# Patient Record
Sex: Female | Born: 2000 | Race: White | Hispanic: No | Marital: Single | Smoking: Former smoker
Health system: Southern US, Community
[De-identification: ages and names within clinical notes are randomized; demographics above are authoritative.]

## PROBLEM LIST (undated history)

## (undated) DIAGNOSIS — F988 Other specified behavioral and emotional disorders with onset usually occurring in childhood and adolescence: Secondary | ICD-10-CM

## (undated) DIAGNOSIS — T7840XA Allergy, unspecified, initial encounter: Secondary | ICD-10-CM

## (undated) DIAGNOSIS — J189 Pneumonia, unspecified organism: Secondary | ICD-10-CM

## (undated) DIAGNOSIS — M959 Acquired deformity of musculoskeletal system, unspecified: Secondary | ICD-10-CM

## (undated) DIAGNOSIS — J020 Streptococcal pharyngitis: Secondary | ICD-10-CM

## (undated) DIAGNOSIS — J45990 Exercise induced bronchospasm: Secondary | ICD-10-CM

## (undated) DIAGNOSIS — F419 Anxiety disorder, unspecified: Secondary | ICD-10-CM

## (undated) HISTORY — PX: MYRINGOTOMY: SUR874

## (undated) HISTORY — DX: Other specified behavioral and emotional disorders with onset usually occurring in childhood and adolescence: F98.8

## (undated) HISTORY — PX: ADENOIDECTOMY: SUR15

## (undated) HISTORY — PX: OTHER SURGICAL HISTORY: SHX169

## (undated) HISTORY — DX: Allergy, unspecified, initial encounter: T78.40XA

---
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
## 2020-08-25 ENCOUNTER — Encounter: Payer: Self-pay | Admitting: Psychiatry

## 2021-08-14 ENCOUNTER — Emergency Department (HOSPITAL_BASED_OUTPATIENT_CLINIC_OR_DEPARTMENT_OTHER)
Admission: EM | Admit: 2021-08-14 | Discharge: 2021-08-14 | Disposition: A | Discharge status: Home / Self Care | Payer: Self-pay | Attending: Emergency Medicine | Admitting: Emergency Medicine

## 2021-08-14 ENCOUNTER — Encounter (HOSPITAL_BASED_OUTPATIENT_CLINIC_OR_DEPARTMENT_OTHER): Payer: Self-pay | Admitting: Emergency Medicine

## 2021-08-14 ENCOUNTER — Emergency Department (HOSPITAL_BASED_OUTPATIENT_CLINIC_OR_DEPARTMENT_OTHER): Payer: Self-pay

## 2021-08-14 DIAGNOSIS — R000 Tachycardia, unspecified: Secondary | ICD-10-CM | POA: Insufficient documentation

## 2021-08-14 DIAGNOSIS — N3000 Acute cystitis without hematuria: Secondary | ICD-10-CM | POA: Insufficient documentation

## 2021-08-14 DIAGNOSIS — J45909 Unspecified asthma, uncomplicated: Secondary | ICD-10-CM | POA: Insufficient documentation

## 2021-08-14 DIAGNOSIS — B9620 Unspecified Escherichia coli [E. coli] as the cause of diseases classified elsewhere: Secondary | ICD-10-CM | POA: Insufficient documentation

## 2021-08-14 DIAGNOSIS — Z87891 Personal history of nicotine dependence: Secondary | ICD-10-CM | POA: Insufficient documentation

## 2021-08-14 LAB — URINALYSIS, ROUTINE W REFLEX MICROSCOPIC
Bilirubin Urine: NEGATIVE
Glucose, UA: NEGATIVE mg/dL
Ketones, ur: 15 mg/dL — AB
Nitrite: POSITIVE — AB
Protein, ur: 100 mg/dL — AB
Specific Gravity, Urine: 1.015 (ref 1.005–1.030)
pH: 7 (ref 5.0–8.0)

## 2021-08-14 LAB — COMPREHENSIVE METABOLIC PANEL
ALT: 12 U/L (ref 0–44)
AST: 16 U/L (ref 15–41)
Albumin: 4.4 g/dL (ref 3.5–5.0)
Alkaline Phosphatase: 51 U/L (ref 38–126)
Anion gap: 10 (ref 5–15)
BUN: 9 mg/dL (ref 6–20)
CO2: 25 mmol/L (ref 22–32)
Calcium: 9.2 mg/dL (ref 8.9–10.3)
Chloride: 102 mmol/L (ref 98–111)
Creatinine, Ser: 0.84 mg/dL (ref 0.44–1.00)
GFR, Estimated: >60 mL/min (ref >60)
Glucose, Bld: 101 mg/dL — ABNORMAL HIGH (ref 70–99)
Potassium: 3.6 mmol/L (ref 3.5–5.1)
Sodium: 137 mmol/L (ref 135–145)
Total Bilirubin: 1.6 mg/dL — ABNORMAL HIGH (ref 0.3–1.2)
Total Protein: 7.5 g/dL (ref 6.5–8.1)

## 2021-08-14 LAB — CBC
HCT: 40.3 % (ref 36.0–46.0)
Hemoglobin: 13.3 g/dL (ref 12.0–15.0)
MCH: 28.7 pg (ref 26.0–34.0)
MCHC: 33 g/dL (ref 30.0–36.0)
MCV: 86.9 fL (ref 80.0–100.0)
Platelets: 181 10*3/uL (ref 150–400)
RBC: 4.64 MIL/uL (ref 3.87–5.11)
RDW: 12.6 % (ref 11.5–15.5)
WBC: 11.9 10*3/uL — ABNORMAL HIGH (ref 4.0–10.5)
nRBC: 0 % (ref 0.0–0.2)

## 2021-08-14 LAB — URINALYSIS, MICROSCOPIC (REFLEX): WBC, UA: >50 WBC/hpf (ref 0–5)

## 2021-08-14 LAB — PREGNANCY, URINE: Preg Test, Ur: NEGATIVE

## 2021-08-14 MED ORDER — ACETAMINOPHEN 325 MG PO TABS
650.0000 mg | ORAL_TABLET | Freq: Once | ORAL | Status: AC
  Administered 2021-08-14: 650 mg via ORAL
  Filled 2021-08-14: qty 2

## 2021-08-14 MED ORDER — CEPHALEXIN 500 MG PO CAPS: 500.0000 mg | ORAL_CAPSULE | Freq: Two times a day (BID) | ORAL | 0 refills | Status: DC

## 2021-08-14 MED ORDER — ONDANSETRON HCL 4 MG PO TABS: 4.0000 mg | ORAL_TABLET | Freq: Four times a day (QID) | ORAL | 0 refills | Status: AC

## 2021-08-14 MED ORDER — SODIUM CHLORIDE 0.9 % IV BOLUS
1000.0000 mL | Freq: Once | INTRAVENOUS | Status: AC
  Administered 2021-08-14: 1000 mL via INTRAVENOUS

## 2021-08-14 MED ORDER — ONDANSETRON HCL 4 MG/2ML IJ SOLN
4.0000 mg | Freq: Once | INTRAMUSCULAR | Status: AC
  Administered 2021-08-14: 4 mg via INTRAVENOUS
  Filled 2021-08-14: qty 2

## 2021-08-14 MED ORDER — SODIUM CHLORIDE 0.9 % IV SOLN
2.0000 g | Freq: Once | INTRAVENOUS | Status: AC
  Administered 2021-08-14: 2 g via INTRAVENOUS
  Filled 2021-08-14: qty 20

## 2021-08-14 NOTE — ED Notes (Signed)
Pt NAD, a/ox4. Pt verbalizes understanding of all DC and f/u instructions. All questions answered. Pt walks with steady gait to lobby at DC.  ? ?

## 2021-08-14 NOTE — ED Notes (Signed)
Pt updated on status.

## 2021-08-14 NOTE — ED Notes (Signed)
Patient transported to CT 

## 2021-08-14 NOTE — ED Triage Notes (Signed)
Pt reports dysuria since this past Monday; also reports RT flank pain

## 2021-08-14 NOTE — ED Notes (Signed)
Return from CT

## 2021-08-14 NOTE — ED Provider Notes (Signed)
[facility] EMERGENCY DEPARTMENT Provider Note   CSN: [id number] Arrival date & time: 08/14/21  1826     History Chief Complaint  Patient presents with   Dysuria    Katherine Davenport is a 20 y.o. female.  The history is provided by the patient.  Dysuria Pain quality:  Burning Pain severity:  Mild Onset quality:  Gradual Duration:  4 days Timing:  Constant Progression:  Unchanged Chronicity:  New Relieved by:  Nothing Worsened by:  Nothing Urinary symptoms: frequent urination   Urinary symptoms: no discolored urine and no foul-smelling urine   Associated symptoms: flank pain (right flank) and nausea   Associated symptoms: no abdominal pain, no fever, no vaginal discharge and no vomiting   Risk factors: no hx of pyelonephritis       Past Medical History:  Diagnosis Date   ADD (attention deficit disorder)    Allergy    Anxiety    Asthma, exercise induced    Bone deformity    cervical spine   Pneumonia    X 3    Strep throat     Patient Active Problem List   Diagnosis Date Noted   Chronic post-traumatic stress disorder 11/27/2018   Recurrent major depression in partial remission (HCC) 11/27/2018   Influenza with respiratory manifestation 12/22/2015   M[place]merz 10/05/2014   Exercise-induced asthma 06/11/2013   Attention deficit hyperactivity disorder (ADHD), combined type, moderate 06/11/2013    Past Surgical History:  Procedure Laterality Date   ADENOIDECTOMY     age 77   Helix implant     By Dr. Dorma Russell   MYRINGOTOMY     Nexplanon placement  11/24/2016   ORIF ELBOW FRACTURE Right 05/01/2016   Procedure: OPEN TREATMENT OF RIGHT OLECRANON FRACTURE;  Surgeon: Mack Hook, MD;  Location: [facility];  Service: Orthopedics;  Laterality: Right;     OB History     Gravida  0   Para  0   Term  0   Preterm  0   AB  0   Living  0      SAB  0   IAB  0   Ectopic  0   Multiple  0   Live Births  0            Family History  Problem Relation Age of Onset   COPD Maternal Grandmother    Heart disease Maternal Grandmother    Stroke Maternal Grandfather    Alcohol abuse Paternal Grandfather    Cancer Paternal Grandmother        Throat cancer    Social History   Tobacco Use   Smoking status: Former    Types: Cigarettes, E-cigarettes   Smokeless tobacco: Never  Vaping Use   Vaping Use: Every day   Substances: Nicotine  Substance Use Topics   Alcohol use: Yes   Drug use: Not Currently    Types: Marijuana    Comment: has smoked several times, Has taken Xanax    Home Medications Prior to Admission medications   Medication Sig Start Date End Date Taking? Authorizing Provider  cephALEXin (KEFLEX) 500 MG capsule Take 1 capsule (500 mg total) by mouth 2 (two) times daily for 10 days. 08/14/21 08/24/21 Yes Patria Warzecha, DO  ondansetron (ZOFRAN) 4 MG tablet Take 1 tablet (4 mg total) by mouth every 6 (six) hours. 08/14/21  Yes Dakoda Bassette, DO  dexmethylphenidate (FOCALIN XR) 10 MG 24 hr capsule Take 1 capsule (10 mg  total) by mouth daily after breakfast for 30 days. 11/27/18 12/27/18  Chauncey Mann, MD  dexmethylphenidate (FOCALIN XR) 10 MG 24 hr capsule Take 1 capsule (10 mg total) by mouth daily after breakfast for 30 days. 12/27/18 01/26/19  Chauncey Mann, MD  dexmethylphenidate (FOCALIN XR) 10 MG 24 hr capsule Take 1 capsule (10 mg total) by mouth daily after breakfast for 30 days. 01/26/19 02/25/19  Chauncey Mann, MD  escitalopram (LEXAPRO) 10 MG tablet TAKE 1 TABLET BY MOUTH EVERYDAY AT BEDTIME 10/07/19   Chauncey Mann, MD  metroNIDAZOLE (FLAGYL) 500 MG tablet Take 1 tablet (500 mg total) by mouth 2 (two) times daily. For 7 days.  Avoid alcohol while taking Patient not taking: Reported on 11/27/2018 10/28/18   Fontaine, Nadyne Coombes, MD  norethindrone-ethinyl estradiol (JUNEL FE,GILDESS FE,LOESTRIN FE) 1-20 MG-MCG tablet Take 1 tablet by mouth daily. 10/28/18   Fontaine, Nadyne Coombes, MD  PROAIR HFA 108 [phone] Base) MCG/ACT inhaler INHALE 2 PUFFS INTO THE LUNGS EVERY 6 (SIX) HOURS AS NEEDED FOR WHEEZING. 10/09/16   McVey, Madelaine Bhat, PA-C    Allergies    Patient has no known allergies.  Review of Systems   Review of Systems  Constitutional:  Negative for chills and fever.  HENT:  Negative for ear pain and sore throat.   Eyes:  Negative for pain and visual disturbance.  Respiratory:  Negative for cough and shortness of breath.   Cardiovascular:  Negative for chest pain and palpitations.  Gastrointestinal:  Positive for nausea. Negative for abdominal pain and vomiting.  Genitourinary:  Positive for dysuria and flank pain (right flank). Negative for decreased urine volume, hematuria, vaginal bleeding and vaginal discharge.  Musculoskeletal:  Negative for arthralgias and back pain.  Skin:  Negative for color change and rash.  Neurological:  Negative for seizures and syncope.  All other systems reviewed and are negative.  Physical Exam Updated Vital Signs BP 116/60   Pulse 100   Temp 99.8 F (37.7 C) (Oral)   Resp 15   Ht 5\' 9"  (1.753 m)   Wt 79.4 kg   LMP 07/13/2021   SpO2 100%   BMI 25.84 kg/m   Physical Exam Vitals and nursing note reviewed.  Constitutional:      General: She is not in acute distress.    Appearance: She is well-developed. She is not ill-appearing.  HENT:     Head: Normocephalic and atraumatic.     Nose: Nose normal.     Mouth/Throat:     Mouth: Mucous membranes are moist.  Eyes:     Extraocular Movements: Extraocular movements intact.     Conjunctiva/sclera: Conjunctivae normal.     Pupils: Pupils are equal, round, and reactive to light.  Cardiovascular:     Rate and Rhythm: Normal rate and regular rhythm.     Pulses: Normal pulses.     Heart sounds: Normal heart sounds. No murmur heard. Pulmonary:     Effort: Pulmonary effort is normal. No respiratory distress.     Breath sounds: Normal breath sounds.  Abdominal:      Palpations: Abdomen is soft.     Tenderness: There is no abdominal tenderness. There is no guarding.  Musculoskeletal:        General: Normal range of motion.     Cervical back: Normal range of motion and neck supple.  Skin:    General: Skin is warm and dry.     Capillary Refill: Capillary refill takes less than  2 seconds.  Neurological:     General: No focal deficit present.     Mental Status: She is alert.    ED Results / Procedures / Treatments   Labs (all labs ordered are listed, but only abnormal results are displayed) Labs Reviewed  URINALYSIS, ROUTINE W REFLEX MICROSCOPIC - Abnormal; Notable for the following components:      Result Value   APPearance CLOUDY (*)    Hgb urine dipstick MODERATE (*)    Ketones, ur 15 (*)    Protein, ur 100 (*)    Nitrite POSITIVE (*)    Leukocytes,Ua SMALL (*)    All other components within normal limits  CBC - Abnormal; Notable for the following components:   WBC 11.9 (*)    All other components within normal limits  COMPREHENSIVE METABOLIC PANEL - Abnormal; Notable for the following components:   Glucose, Bld 101 (*)    Total Bilirubin 1.6 (*)    All other components within normal limits  URINALYSIS, MICROSCOPIC (REFLEX) - Abnormal; Notable for the following components:   Bacteria, UA MANY (*)    All other components within normal limits  CULTURE, BLOOD (ROUTINE X 2)  CULTURE, BLOOD (ROUTINE X 2)  URINE CULTURE  PREGNANCY, URINE    EKG None  Radiology CT Renal Stone Study  Result Date: 08/14/2021 CLINICAL DATA:  Right flank pain and dysuria EXAM: CT ABDOMEN AND PELVIS WITHOUT CONTRAST TECHNIQUE: Multidetector CT imaging of the abdomen and pelvis was performed following the standard protocol without IV contrast. COMPARISON:  None. FINDINGS: LOWER CHEST: Normal. HEPATOBILIARY: Normal hepatic contours. No intra- or extrahepatic biliary dilatation. The gallbladder is normal. PANCREAS: Normal pancreas. No ductal dilatation or  peripancreatic fluid collection. SPLEEN: Normal. ADRENALS/URINARY TRACT: The adrenal glands are normal. No hydronephrosis, nephroureterolithiasis or solid renal mass. The urinary bladder is normal for degree of distention STOMACH/BOWEL: There is no hiatal hernia. Normal duodenal course and caliber. No small bowel dilatation or inflammation. No focal colonic abnormality. Normal appendix. VASCULAR/LYMPHATIC: Normal course and caliber of the major abdominal vessels. No abdominal or pelvic lymphadenopathy. REPRODUCTIVE: Normal uterus. No adnexal mass. MUSCULOSKELETAL. No bony spinal canal stenosis or focal osseous abnormality. OTHER: None. IMPRESSION: No acute abnormality of the abdomen or pelvis. Electronically Signed   By: Deatra Robinson M.D.   On: 08/14/2021 21:40    Procedures Procedures   Medications Ordered in ED Medications  acetaminophen (TYLENOL) tablet 650 mg (650 mg Oral Given 08/14/21 1937)  cefTRIAXone (ROCEPHIN) 2 g in sodium chloride 0.9 % 100 mL IVPB (0 g Intravenous Stopped 08/14/21 2105)  sodium chloride 0.9 % bolus 1,000 mL (1,000 mLs Intravenous New Bag/Given 08/14/21 2010)  ondansetron (ZOFRAN) injection 4 mg (4 mg Intravenous Given 08/14/21 2014)    ED Course  I have reviewed the triage vital signs and the nursing notes.  Pertinent labs & imaging results that were available during my care of the patient were reviewed by me and considered in my medical decision making (see chart for details).    MDM Rules/Calculators/A&P                           Kalesha Irving is here with dysuria.  Febrile and tachycardic upon arrival.  Having some suprapubic pain, pain with urination, flank pain.  No real abdominal tenderness on exam.  Overall well-appearing.  Infectious order set was ordered prior to my evaluation.  Have low suspicion for sepsis given no major medical  problems.  Tachycardia and fever improved following fluids and Tylenol.  Has mild white count.  Urinalysis consistent with  infection.  CT scan showed no evidence of kidney stone or pyelonephritis or cholecystitis.  Overall suspect UTI.  Was given a dose of Rocephin and Zofran.  She is well-appearing.  Not having any nausea or vomiting.  Will prescribe Zofran to use as needed.  Will prescribe antibiotics.  Discharged in good condition.  This chart was dictated using voice recognition software.  Despite best efforts to proofread,  errors can occur which can change the documentation meaning.    Final Clinical Impression(s) / ED Diagnoses Final diagnoses:  Acute cystitis without hematuria    Rx / DC Orders ED Discharge Orders          Ordered    cephALEXin (KEFLEX) 500 MG capsule  2 times daily        08/14/21 2207    ondansetron (ZOFRAN) 4 MG tablet  Every 6 hours        08/14/21 2207             Virgina Norfolk, DO 08/14/21 2207

## 2021-08-15 ENCOUNTER — Telehealth (HOSPITAL_BASED_OUTPATIENT_CLINIC_OR_DEPARTMENT_OTHER): Payer: Self-pay | Admitting: *Deleted

## 2021-08-15 ENCOUNTER — Other Ambulatory Visit: Payer: Self-pay

## 2021-08-15 ENCOUNTER — Emergency Department (HOSPITAL_BASED_OUTPATIENT_CLINIC_OR_DEPARTMENT_OTHER)
Admission: EM | Admit: 2021-08-15 | Discharge: 2021-08-15 | Disposition: A | Discharge status: Home / Self Care | Payer: Self-pay | Attending: Emergency Medicine | Admitting: Emergency Medicine

## 2021-08-15 ENCOUNTER — Encounter (HOSPITAL_BASED_OUTPATIENT_CLINIC_OR_DEPARTMENT_OTHER): Payer: Self-pay | Admitting: Obstetrics and Gynecology

## 2021-08-15 DIAGNOSIS — R7881 Bacteremia: Secondary | ICD-10-CM

## 2021-08-15 DIAGNOSIS — N3000 Acute cystitis without hematuria: Secondary | ICD-10-CM | POA: Insufficient documentation

## 2021-08-15 DIAGNOSIS — Z87891 Personal history of nicotine dependence: Secondary | ICD-10-CM | POA: Insufficient documentation

## 2021-08-15 DIAGNOSIS — B9620 Unspecified Escherichia coli [E. coli] as the cause of diseases classified elsewhere: Secondary | ICD-10-CM | POA: Insufficient documentation

## 2021-08-15 DIAGNOSIS — J45909 Unspecified asthma, uncomplicated: Secondary | ICD-10-CM | POA: Insufficient documentation

## 2021-08-15 LAB — BLOOD CULTURE ID PANEL (REFLEXED) - BCID2

## 2021-08-15 LAB — CBC WITH DIFFERENTIAL/PLATELET
Abs Immature Granulocytes: 0.03 10*3/uL (ref 0.00–0.07)
Basophils Absolute: 0 10*3/uL (ref 0.0–0.1)
Basophils Relative: 0 %
Eosinophils Absolute: 0.1 10*3/uL (ref 0.0–0.5)
Eosinophils Relative: 1 %
HCT: 39.7 % (ref 36.0–46.0)
Hemoglobin: 12.7 g/dL (ref 12.0–15.0)
Immature Granulocytes: 0 %
Lymphocytes Relative: 17 %
Lymphs Abs: 1.5 10*3/uL (ref 0.7–4.0)
MCH: 27.9 pg (ref 26.0–34.0)
MCHC: 32 g/dL (ref 30.0–36.0)
MCV: 87.3 fL (ref 80.0–100.0)
Monocytes Absolute: 1.1 10*3/uL — ABNORMAL HIGH (ref 0.1–1.0)
Monocytes Relative: 12 %
Neutro Abs: 6.2 10*3/uL (ref 1.7–7.7)
Neutrophils Relative %: 70 %
Platelets: 167 10*3/uL (ref 150–400)
RBC: 4.55 MIL/uL (ref 3.87–5.11)
RDW: 12.7 % (ref 11.5–15.5)
WBC: 8.9 10*3/uL (ref 4.0–10.5)
nRBC: 0 % (ref 0.0–0.2)

## 2021-08-15 LAB — BASIC METABOLIC PANEL
Anion gap: 10 (ref 5–15)
BUN: 7 mg/dL (ref 6–20)
CO2: 27 mmol/L (ref 22–32)
Calcium: 9 mg/dL (ref 8.9–10.3)
Chloride: 100 mmol/L (ref 98–111)
Creatinine, Ser: 0.71 mg/dL (ref 0.44–1.00)
GFR, Estimated: >60 mL/min (ref >60)
Glucose, Bld: 100 mg/dL — ABNORMAL HIGH (ref 70–99)
Potassium: 3.4 mmol/L — ABNORMAL LOW (ref 3.5–5.1)
Sodium: 137 mmol/L (ref 135–145)

## 2021-08-15 LAB — LACTIC ACID, PLASMA: Lactic Acid, Venous: 0.8 mmol/L (ref 0.5–1.9)

## 2021-08-15 MED ORDER — CEFDINIR 300 MG PO CAPS
300.0000 mg | ORAL_CAPSULE | Freq: Once | ORAL | Status: AC
  Administered 2021-08-15: 300 mg via ORAL
  Filled 2021-08-15: qty 1

## 2021-08-15 MED ORDER — OXYCODONE-ACETAMINOPHEN 5-325 MG PO TABS
1.0000 | ORAL_TABLET | ORAL | Status: DC | PRN
  Administered 2021-08-15: 1 via ORAL
  Filled 2021-08-15: qty 1

## 2021-08-15 MED ORDER — CEFDINIR 300 MG PO CAPS: 300.0000 mg | ORAL_CAPSULE | Freq: Two times a day (BID) | ORAL | 0 refills | Status: AC

## 2021-08-15 NOTE — ED Provider Notes (Signed)
[facility] EMERGENCY DEPT Provider Note   CSN: [id number] Arrival date & time: 08/15/21  1742     History Chief Complaint  Patient presents with   Abnormal Lab    Katherine Davenport is a 20 y.o. female.  Patient called back to the ED because she had a positive blood culture for E. coli.  Has felt much better today.  No fevers or pain.  She has been taking antibiotic as prescribed.   Abnormal Lab Patient referred by:  ED personnel Resulting agency:  Internal Result type: cultures   Cultures:    Blood culture: abnormal       Past Medical History:  Diagnosis Date   ADD (attention deficit disorder)    Allergy    Anxiety    Asthma, exercise induced    Bone deformity    cervical spine   Pneumonia    X 3    Strep throat     Patient Active Problem List   Diagnosis Date Noted   Chronic post-traumatic stress disorder 11/27/2018   Recurrent major depression in partial remission (HCC) 11/27/2018   Influenza with respiratory manifestation 12/22/2015   Mittelschmerz 10/05/2014   Exercise-induced asthma 06/11/2013   Attention deficit hyperactivity disorder (ADHD), combined type, moderate 06/11/2013    Past Surgical History:  Procedure Laterality Date   ADENOIDECTOMY     age 21   Helix implant     By Dr. Dorma Russell   MYRINGOTOMY     Nexplanon placement  11/24/2016   ORIF ELBOW FRACTURE Right 05/01/2016   Procedure: OPEN TREATMENT OF RIGHT OLECRANON FRACTURE;  Surgeon: Mack Hook, MD;  Location: [facility];  Service: Orthopedics;  Laterality: Right;     OB History     Gravida  0   Para  0   Term  0   Preterm  0   AB  0   Living  0      SAB  0   IAB  0   Ectopic  0   Multiple  0   Live Births  0           Family History  Problem Relation Age of Onset   COPD Maternal Grandmother    Heart disease Maternal Grandmother    Stroke Maternal Grandfather    Alcohol abuse Paternal Grandfather    Cancer Paternal  Grandmother        Throat cancer    Social History   Tobacco Use   Smoking status: Former    Types: Cigarettes, E-cigarettes   Smokeless tobacco: Never  Vaping Use   Vaping Use: Every day   Substances: Nicotine  Substance Use Topics   Alcohol use: Yes   Drug use: Not Currently    Types: Marijuana    Comment: has smoked several times, Has taken Xanax    Home Medications Prior to Admission medications   Medication Sig Start Date End Date Taking? Authorizing Provider  cefdinir (OMNICEF) 300 MG capsule Take 1 capsule (300 mg total) by mouth 2 (two) times daily for 7 days. 08/15/21 08/22/21 Yes Alcide Memoli, DO  dexmethylphenidate (FOCALIN XR) 10 MG 24 hr capsule Take 1 capsule (10 mg total) by mouth daily after breakfast for 30 days. 11/27/18 12/27/18  Chauncey Mann, MD  dexmethylphenidate (FOCALIN XR) 10 MG 24 hr capsule Take 1 capsule (10 mg total) by mouth daily after breakfast for 30 days. 12/27/18 01/26/19  Chauncey Mann, MD  dexmethylphenidate (FOCALIN XR) 10 MG 24  hr capsule Take 1 capsule (10 mg total) by mouth daily after breakfast for 30 days. 01/26/19 02/25/19  Chauncey Mann, MD  escitalopram (LEXAPRO) 10 MG tablet TAKE 1 TABLET BY MOUTH EVERYDAY AT BEDTIME 10/07/19   Chauncey Mann, MD  metroNIDAZOLE (FLAGYL) 500 MG tablet Take 1 tablet (500 mg total) by mouth 2 (two) times daily. For 7 days.  Avoid alcohol while taking Patient not taking: Reported on 11/27/2018 10/28/18   Fontaine, Nadyne Coombes, MD  norethindrone-ethinyl estradiol (JUNEL FE,GILDESS FE,LOESTRIN FE) 1-20 MG-MCG tablet Take 1 tablet by mouth daily. 10/28/18   Fontaine, Nadyne Coombes, MD  ondansetron (ZOFRAN) 4 MG tablet Take 1 tablet (4 mg total) by mouth every 6 (six) hours. 08/14/21   Denzal Meir, DO  PROAIR HFA 108 (90 Base) MCG/ACT inhaler INHALE 2 PUFFS INTO THE LUNGS EVERY 6 (SIX) HOURS AS NEEDED FOR WHEEZING. 10/09/16   McVey, Madelaine Bhat, PA-C    Allergies    Patient has no known  allergies.  Review of Systems   Review of Systems  Constitutional:  Negative for chills and fever.  HENT:  Negative for ear pain and sore throat.   Eyes:  Negative for pain and visual disturbance.  Respiratory:  Negative for cough and shortness of breath.   Cardiovascular:  Negative for chest pain and palpitations.  Gastrointestinal:  Negative for abdominal pain and vomiting.  Genitourinary:  Negative for dysuria and hematuria.  Musculoskeletal:  Negative for arthralgias and back pain.  Skin:  Negative for color change and rash.  Neurological:  Negative for seizures and syncope.  All other systems reviewed and are negative.  Physical Exam Updated Vital Signs BP 133/79 (BP Location: Right Arm)   Pulse 96   Temp 98.7 F (37.1 C)   Resp 16   Ht 5\' 9"  (1.753 m)   Wt 79.4 kg   LMP 07/23/2021 (Exact Date)   SpO2 98%   BMI 25.85 kg/m   Physical Exam Vitals and nursing note reviewed.  Constitutional:      General: She is not in acute distress.    Appearance: She is well-developed.  HENT:     Head: Normocephalic and atraumatic.  Eyes:     Conjunctiva/sclera: Conjunctivae normal.  Cardiovascular:     Rate and Rhythm: Normal rate and regular rhythm.     Pulses: Normal pulses.     Heart sounds: Normal heart sounds. No murmur heard. Pulmonary:     Effort: Pulmonary effort is normal. No respiratory distress.     Breath sounds: Normal breath sounds.  Abdominal:     Palpations: Abdomen is soft.     Tenderness: There is no abdominal tenderness.  Musculoskeletal:     Cervical back: Neck supple.  Skin:    General: Skin is warm and dry.  Neurological:     Mental Status: She is alert.    ED Results / Procedures / Treatments   Labs (all labs ordered are listed, but only abnormal results are displayed) Labs Reviewed  CBC WITH DIFFERENTIAL/PLATELET - Abnormal; Notable for the following components:      Result Value   Monocytes Absolute 1.1 (*)    All other components within  normal limits  BASIC METABOLIC PANEL - Abnormal; Notable for the following components:   Potassium 3.4 (*)    Glucose, Bld 100 (*)    All other components within normal limits  LACTIC ACID, PLASMA    EKG None  Radiology CT Renal Stone Study  Result Date: 08/14/2021  CLINICAL DATA:  Right flank pain and dysuria EXAM: CT ABDOMEN AND PELVIS WITHOUT CONTRAST TECHNIQUE: Multidetector CT imaging of the abdomen and pelvis was performed following the standard protocol without IV contrast. COMPARISON:  None. FINDINGS: LOWER CHEST: Normal. HEPATOBILIARY: Normal hepatic contours. No intra- or extrahepatic biliary dilatation. The gallbladder is normal. PANCREAS: Normal pancreas. No ductal dilatation or peripancreatic fluid collection. SPLEEN: Normal. ADRENALS/URINARY TRACT: The adrenal glands are normal. No hydronephrosis, nephroureterolithiasis or solid renal mass. The urinary bladder is normal for degree of distention STOMACH/BOWEL: There is no hiatal hernia. Normal duodenal course and caliber. No small bowel dilatation or inflammation. No focal colonic abnormality. Normal appendix. VASCULAR/LYMPHATIC: Normal course and caliber of the major abdominal vessels. No abdominal or pelvic lymphadenopathy. REPRODUCTIVE: Normal uterus. No adnexal mass. MUSCULOSKELETAL. No bony spinal canal stenosis or focal osseous abnormality. OTHER: None. IMPRESSION: No acute abnormality of the abdomen or pelvis. Electronically Signed   By: Deatra Robinson M.D.   On: 08/14/2021 21:40    Procedures Procedures   Medications Ordered in ED Medications  cefdinir (OMNICEF) capsule 300 mg (has no administration in time range)    ED Course  I have reviewed the triage vital signs and the nursing notes.  Pertinent labs & imaging results that were available during my care of the patient were reviewed by me and considered in my medical decision making (see chart for details).    MDM Rules/Calculators/A&P                            Wandy Bossler is here because she tested positive for E. coli and blood culture.  I saw patient yesterday for urinary tract infection.  Vital signs are normal today.  No fever.  No significant leukocytosis.  Normal lactic acid.  No concern clinically for sepsis.  Talked with both infectious disease and pharmacy and overall we will start patient on cefdinir 300 twice daily for 1 week.  Overall it appears that sensitivities are not back yet but appears that this will likely be a good regimen for the patient.  She understands return precautions.  Discharged in good condition.  ID did not recommend getting repeat cultures at this time.  This chart was dictated using voice recognition software.  Despite best efforts to proofread,  errors can occur which can change the documentation meaning.   Final Clinical Impression(s) / ED Diagnoses Final diagnoses:  Acute cystitis without hematuria  E coli bacteremia    Rx / DC Orders ED Discharge Orders          Ordered    cefdinir (OMNICEF) 300 MG capsule  2 times daily        08/15/21 2223             Virgina Norfolk, DO 08/15/21 2228

## 2021-08-15 NOTE — ED Triage Notes (Signed)
Patient reports to the ER for blood culture bottles positive for E-coli. Patient denies new symptoms.

## 2021-08-15 NOTE — Discharge Instructions (Addendum)
We have started you on new antibiotic called cefdinir.  First antibiotic was given today.  Continue next dose tomorrow morning twice a day for 1 week.  We will be in contact if we need to change the antibiotic again.  Please return if you develop any worsening fever, nausea, vomiting, pain.  Lab work today is within normal limits.  Vital signs are normal.  Please discontinue antibiotic that was prescribed yesterday.

## 2021-08-17 LAB — CULTURE, BLOOD (ROUTINE X 2): Special Requests: ADEQUATE

## 2021-08-17 LAB — URINE CULTURE: Culture: >=100000 — AB

## 2021-08-18 ENCOUNTER — Telehealth: Payer: Self-pay | Admitting: *Deleted

## 2021-08-18 NOTE — Telephone Encounter (Signed)
Post ED Visit - Positive Culture Follow-up  Culture report reviewed by antimicrobial stewardship pharmacist: [facility] []  , Pharm.D. []  Enzo Bi, .D., BCPS AQ-ID []  Celedonio Miyamoto, Pharm.D., BCPS []  [address], .D., BCPS []  [place]ig, .D., BCPS, AAHIVP []  Georgina Pillion, Pharm.D., BCPS, AAHIVP []  [address], PharmD, BCPS []  , PharmD, BCPS []  [place], PharmD, BCPS []  [address], PharmD []  , PharmD, BCPS []  Estella Husk, PharmD  Pharmacy Team []  Lysle Pearl, PharmD []  , PharmD []  Phillips Climes, PharmD []  , Rph []  Agapito Games) , PharmD []  Verlan Friends, PharmD []  , PharmD []  Mervyn Gay, PharmD []  , PharmD []  Vinnie Level, PharmD []  [facility], PharmD []  , PharmD []  Len Childs, PharmD   Positive blood culture Treated with Cephalexin, organism sensitive to the same and no further patient follow-up is required at this time.  [facility] 08/18/2021, 8:57 AM

## 2021-08-19 LAB — CULTURE, BLOOD (ROUTINE X 2)
Culture: NO GROWTH
Special Requests: ADEQUATE

## 2022-10-29 ENCOUNTER — Encounter (HOSPITAL_BASED_OUTPATIENT_CLINIC_OR_DEPARTMENT_OTHER): Payer: Self-pay | Admitting: Emergency Medicine

## 2022-10-29 ENCOUNTER — Other Ambulatory Visit: Payer: Self-pay

## 2022-10-29 ENCOUNTER — Emergency Department (HOSPITAL_BASED_OUTPATIENT_CLINIC_OR_DEPARTMENT_OTHER)
Admission: EM | Admit: 2022-10-29 | Discharge: 2022-10-29 | Disposition: A | Discharge status: Home / Self Care | Payer: Self-pay | Attending: Emergency Medicine | Admitting: Emergency Medicine

## 2022-10-29 DIAGNOSIS — Z20822 Contact with and (suspected) exposure to covid-19: Secondary | ICD-10-CM | POA: Insufficient documentation

## 2022-10-29 DIAGNOSIS — R112 Nausea with vomiting, unspecified: Secondary | ICD-10-CM

## 2022-10-29 DIAGNOSIS — R197 Diarrhea, unspecified: Secondary | ICD-10-CM | POA: Insufficient documentation

## 2022-10-29 LAB — URINALYSIS, MICROSCOPIC (REFLEX)

## 2022-10-29 LAB — COMPREHENSIVE METABOLIC PANEL
ALT: 17 U/L (ref 0–44)
AST: 19 U/L (ref 15–41)
Albumin: 5 g/dL (ref 3.5–5.0)
Alkaline Phosphatase: 42 U/L (ref 38–126)
Anion gap: 7 (ref 5–15)
BUN: 14 mg/dL (ref 6–20)
CO2: 24 mmol/L (ref 22–32)
Calcium: 9.7 mg/dL (ref 8.9–10.3)
Chloride: 107 mmol/L (ref 98–111)
Creatinine, Ser: 0.68 mg/dL (ref 0.44–1.00)
GFR, Estimated: >60 mL/min (ref >60)
Glucose, Bld: 123 mg/dL — ABNORMAL HIGH (ref 70–99)
Potassium: 3.9 mmol/L (ref 3.5–5.1)
Sodium: 138 mmol/L (ref 135–145)
Total Bilirubin: 1.1 mg/dL (ref 0.3–1.2)
Total Protein: 8.6 g/dL — ABNORMAL HIGH (ref 6.5–8.1)

## 2022-10-29 LAB — LIPASE, BLOOD: Lipase: 31 U/L (ref 11–51)

## 2022-10-29 LAB — CBC WITH DIFFERENTIAL/PLATELET
Abs Immature Granulocytes: 0.07 10*3/uL (ref 0.00–0.07)
Basophils Absolute: 0 10*3/uL (ref 0.0–0.1)
Basophils Relative: 0 %
Eosinophils Absolute: 0 10*3/uL (ref 0.0–0.5)
Eosinophils Relative: 0 %
HCT: 45 % (ref 36.0–46.0)
Hemoglobin: 14.6 g/dL (ref 12.0–15.0)
Immature Granulocytes: 0 %
Lymphocytes Relative: 2 %
Lymphs Abs: 0.3 10*3/uL — ABNORMAL LOW (ref 0.7–4.0)
MCH: 27.9 pg (ref 26.0–34.0)
MCHC: 32.4 g/dL (ref 30.0–36.0)
MCV: 85.9 fL (ref 80.0–100.0)
Monocytes Absolute: 0.5 10*3/uL (ref 0.1–1.0)
Monocytes Relative: 3 %
Neutro Abs: 16.4 10*3/uL — ABNORMAL HIGH (ref 1.7–7.7)
Neutrophils Relative %: 95 %
Platelets: 223 10*3/uL (ref 150–400)
RBC: 5.24 MIL/uL — ABNORMAL HIGH (ref 3.87–5.11)
RDW: 12.5 % (ref 11.5–15.5)
WBC: 17.3 10*3/uL — ABNORMAL HIGH (ref 4.0–10.5)
nRBC: 0 % (ref 0.0–0.2)

## 2022-10-29 LAB — URINALYSIS, ROUTINE W REFLEX MICROSCOPIC
Glucose, UA: NEGATIVE mg/dL
Ketones, ur: 40 mg/dL — AB
Leukocytes,Ua: NEGATIVE
Nitrite: NEGATIVE
Protein, ur: 100 mg/dL — AB
Specific Gravity, Urine: >=1.03 (ref 1.005–1.030)
pH: 5.5 (ref 5.0–8.0)

## 2022-10-29 LAB — RESP PANEL BY RT-PCR (RSV, FLU A&B, COVID)  RVPGX2
Influenza A by PCR: NEGATIVE
Influenza B by PCR: NEGATIVE
Resp Syncytial Virus by PCR: NEGATIVE
SARS Coronavirus 2 by RT PCR: NEGATIVE

## 2022-10-29 LAB — PREGNANCY, URINE: Preg Test, Ur: NEGATIVE

## 2022-10-29 MED ORDER — ONDANSETRON 4 MG PO TBDP: 4.0000 mg | ORAL_TABLET | Freq: Three times a day (TID) | ORAL | 0 refills | Status: AC | PRN

## 2022-10-29 MED ORDER — ONDANSETRON 4 MG PO TBDP
8.0000 mg | ORAL_TABLET | Freq: Once | ORAL | Status: AC
  Administered 2022-10-29: 8 mg via ORAL
  Filled 2022-10-29: qty 2

## 2022-10-29 MED ORDER — SODIUM CHLORIDE 0.9 % IV BOLUS
1000.0000 mL | Freq: Once | INTRAVENOUS | Status: AC
  Administered 2022-10-29: 1000 mL via INTRAVENOUS

## 2022-10-29 NOTE — Discharge Instructions (Addendum)
Review visit to emergency department today was overall reassuring.  As discussed, take Zofran as needed for feelings of nausea as well as vomiting.  Recommend rehydration via electrolyte rich fluids such as sugar-free Gatorade.,  Pedialyte, body armor.  Recommend follow-up with primary care for reassessment in 7 to 10 days.  Follow MyChart for the results of your pregnancy test.  Please not hesitate to return to emergency department if the worrisome signs and symptoms we discussed become apparent.

## 2022-10-29 NOTE — ED Provider Notes (Signed)
[place] EMERGENCY DEPARTMENT Provider Note   CSN: [id number] Arrival date & time: 10/29/22  [id number]     History  Chief Complaint  Patient presents with   Emesis    Katherine Davenport is a 21 y.o. female.   Emesis   21 year old female presents emergency department with complaints of nausea and vomiting apparent around 4 AM this morning.  Patient is concerned that she possibly contracted food poisoning from dinner she ate last night.  She reports associated diarrhea beginning about the same time.  Reports vomiting 20+ times since onset this morning.  Denies history of abdominal surgery.  Denies abdominal pain, fever, chills, night sweats, chest pain, shortness of breath, urinary/vaginal symptoms.  Past medical history significant for asthma, anxiety, ADD, PTSD, Mittelschmerz  Home Medications Prior to Admission medications   Medication Sig Start Date End Date Taking? Authorizing Provider  ondansetron (ZOFRAN-ODT) 4 MG disintegrating tablet Take 1 tablet (4 mg total) by mouth every 8 (eight) hours as needed for nausea or vomiting. 10/29/22  Yes Dion Saucier A, PA  dexmethylphenidate (FOCALIN XR) 10 MG 24 hr capsule Take 1 capsule (10 mg total) by mouth daily after breakfast for 30 days. 11/27/18 12/27/18  Delight Hoh, MD  dexmethylphenidate (FOCALIN XR) 10 MG 24 hr capsule Take 1 capsule (10 mg total) by mouth daily after breakfast for 30 days. 12/27/18 01/26/19  Delight Hoh, MD  dexmethylphenidate (FOCALIN XR) 10 MG 24 hr capsule Take 1 capsule (10 mg total) by mouth daily after breakfast for 30 days. 01/26/19 02/25/19  Delight Hoh, MD  escitalopram (LEXAPRO) 10 MG tablet TAKE 1 TABLET BY MOUTH EVERYDAY AT BEDTIME 10/07/19   Delight Hoh, MD  metroNIDAZOLE (FLAGYL) 500 MG tablet Take 1 tablet (500 mg total) by mouth 2 (two) times daily. For 7 days.  Avoid alcohol while taking Patient not taking: Reported on 11/27/2018 10/28/18   Fontaine, Belinda Block, MD   norethindrone-ethinyl estradiol (JUNEL FE,GILDESS FE,LOESTRIN FE) 1-20 MG-MCG tablet Take 1 tablet by mouth daily. 10/28/18   Fontaine, Belinda Block, MD  ondansetron (ZOFRAN) 4 MG tablet Take 1 tablet (4 mg total) by mouth every 6 (six) hours. 08/14/21   Curatolo, Adam, DO  PROAIR HFA 108 (90 Base) MCG/ACT inhaler INHALE 2 PUFFS INTO THE LUNGS EVERY 6 (SIX) HOURS AS NEEDED FOR WHEEZING. 10/09/16   McVey, Gelene Mink, PA-C      Allergies    Patient has no known allergies.    Review of Systems   Review of Systems  Gastrointestinal:  Positive for vomiting.  All other systems reviewed and are negative.   Physical Exam Updated Vital Signs BP (!) 92/52 (BP Location: Left Arm)   Pulse 84   Temp 98 F (36.7 C) (Oral)   Resp 17   Ht 5\' 9"  (1.753 m)   Wt 79.4 kg   LMP 10/29/2022   SpO2 100%   BMI 25.84 kg/m  Physical Exam Vitals and nursing note reviewed.  Constitutional:      General: She is not in acute distress.    Appearance: She is well-developed.  HENT:     Head: Normocephalic and atraumatic.  Eyes:     Conjunctiva/sclera: Conjunctivae normal.  Cardiovascular:     Rate and Rhythm: Normal rate and regular rhythm.     Heart sounds: No murmur heard. Pulmonary:     Effort: Pulmonary effort is normal. No respiratory distress.     Breath sounds: Normal breath sounds. No wheezing, rhonchi  or rales.  Abdominal:     Palpations: Abdomen is soft.     Tenderness: There is no abdominal tenderness.  Musculoskeletal:        General: No swelling.     Cervical back: Neck supple.  Skin:    General: Skin is warm and dry.     Capillary Refill: Capillary refill takes less than 2 seconds.  Neurological:     Mental Status: She is alert.  Psychiatric:        Mood and Affect: Mood normal.     ED Results / Procedures / Treatments   Labs (all labs ordered are listed, but only abnormal results are displayed) Labs Reviewed  COMPREHENSIVE METABOLIC PANEL - Abnormal; Notable for the  following components:      Result Value   Glucose, Bld 123 (*)    Total Protein 8.6 (*)    All other components within normal limits  CBC WITH DIFFERENTIAL/PLATELET - Abnormal; Notable for the following components:   WBC 17.3 (*)    RBC 5.24 (*)    Neutro Abs 16.4 (*)    Lymphs Abs 0.3 (*)    All other components within normal limits  RESP PANEL BY RT-PCR (RSV, FLU A&B, COVID)  RVPGX2  LIPASE, BLOOD  URINALYSIS, ROUTINE W REFLEX MICROSCOPIC  PREGNANCY, URINE    EKG None  Radiology No results found.  Procedures Procedures    Medications Ordered in ED Medications  ondansetron (ZOFRAN-ODT) disintegrating tablet 8 mg (8 mg Oral Given 10/29/22 1020)  sodium chloride 0.9 % bolus 1,000 mL (1,000 mLs Intravenous New Bag/Given 10/29/22 1123)    ED Course/ Medical Decision Making/ A&P                           Medical Decision Making Amount and/or Complexity of Data Reviewed Labs: ordered.  Risk Prescription drug management.   This patient presents to the ED for concern of nausea vomiting diarrhea, this involves an extensive number of treatment options, and is a complaint that carries with it a high risk of complications and morbidity.  The differential diagnosis includes gastroenteritis, pregnancy, influenza, COVID, RSV, food poisoning, electrolyte abnormalities, drug intoxication/withdrawal   Co morbidities that complicate the patient evaluation  See HPI   Additional history obtained:  Additional history obtained from EMR External records from outside source obtained and reviewed including hospital records   Lab Tests:  I Ordered, and personally interpreted labs.  The pertinent results include: Leukocytosis of 17.3.  No evidence anemia.  Platelets within normal range.  No electrolyte abnormalities appreciated.  No transaminitis.  No renal dysfunction.  Lipase within normal range.  Respiratory panel negative.  UA and urine pregnancy pending.   Imaging Studies  ordered:  N/a   Cardiac Monitoring: / EKG:  The patient was maintained on a cardiac monitor.  I personally viewed and interpreted the cardiac monitored which showed an underlying rhythm of: Sinus rhythm   Consultations Obtained:  N/a   Problem List / ED Course / Critical interventions / Medication management  Viral gastroenteritis I ordered medication including Zofran for nausea, 1 L normal saline for rehydration.    Reevaluation of the patient after these medicines showed that the patient improved I have reviewed the patients home medicines and have made adjustments as needed   Social Determinants of Health:  Former cigarette use.  Denies illicit drug use.   Test / Admission - Considered:  Nausea/vomiting/diarrhea Vitals signs significant for initial  tachycardia with a heart rate of 134 which decreased with administration of antiemetic as well as IV fluids.. Otherwise within normal range and stable throughout visit. Laboratory/imaging studies significant for: See above Patient symptoms likely secondary to viral gastroenteritis versus food poisoning.  Patient responded well to Zofran administered while emergency department.  She was without abdominal pain the entirety of symptoms experienced.  Workup overall reassuring.  Patient to be given Zofran to take as needed outpatient for nausea/vomiting.  Patient tolerated p.o. without difficulty while in the emergency department without repeat episodes of emesis.  Patient currently without urinary symptoms with recent last menstrual period so she elected to follow MyChart for results of urine.  Recommend follow-up with primary care in 7 to 10 days.  Treatment plan discussed at length with patient and she acknowledged understanding was agreeable to said plan. Worrisome signs and symptoms were discussed with the patient, and the patient acknowledged understanding to return to the ED if noticed. Patient was stable upon discharge.           Final Clinical Impression(s) / ED Diagnoses Final diagnoses:  Nausea vomiting and diarrhea    Rx / DC Orders ED Discharge Orders          Ordered    ondansetron (ZOFRAN-ODT) 4 MG disintegrating tablet  Every 8 hours PRN        10/29/22 1134              Wilnette Kales, [place] 10/29/22 1250    Fredia Sorrow, MD 10/30/22 1714

## 2022-10-29 NOTE — ED Triage Notes (Signed)
Pt c/o nausea and vomiting onset today.
